# Patient Record
Sex: Female | Born: 1995 | Race: Black or African American | Hispanic: No | Marital: Single | Smoking: Never smoker
Health system: Southern US, Community
[De-identification: ages and names within clinical notes are randomized; demographics above are authoritative.]

## PROBLEM LIST (undated history)

## (undated) DIAGNOSIS — R569 Unspecified convulsions: Secondary | ICD-10-CM

## (undated) DIAGNOSIS — G43909 Migraine, unspecified, not intractable, without status migrainosus: Secondary | ICD-10-CM

## (undated) DIAGNOSIS — L0291 Cutaneous abscess, unspecified: Secondary | ICD-10-CM

---
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
## 2019-11-08 DIAGNOSIS — Z419 Encounter for procedure for purposes other than remedying health state, unspecified: Secondary | ICD-10-CM | POA: Diagnosis not present

## 2019-12-09 DIAGNOSIS — Z419 Encounter for procedure for purposes other than remedying health state, unspecified: Secondary | ICD-10-CM | POA: Diagnosis not present

## 2020-01-09 DIAGNOSIS — Z419 Encounter for procedure for purposes other than remedying health state, unspecified: Secondary | ICD-10-CM | POA: Diagnosis not present

## 2020-02-08 DIAGNOSIS — Z419 Encounter for procedure for purposes other than remedying health state, unspecified: Secondary | ICD-10-CM | POA: Diagnosis not present

## 2020-03-10 DIAGNOSIS — Z419 Encounter for procedure for purposes other than remedying health state, unspecified: Secondary | ICD-10-CM | POA: Diagnosis not present

## 2020-04-09 DIAGNOSIS — Z419 Encounter for procedure for purposes other than remedying health state, unspecified: Secondary | ICD-10-CM | POA: Diagnosis not present

## 2020-05-10 DIAGNOSIS — Z419 Encounter for procedure for purposes other than remedying health state, unspecified: Secondary | ICD-10-CM | POA: Diagnosis not present

## 2020-06-10 DIAGNOSIS — Z419 Encounter for procedure for purposes other than remedying health state, unspecified: Secondary | ICD-10-CM | POA: Diagnosis not present

## 2020-07-08 DIAGNOSIS — Z419 Encounter for procedure for purposes other than remedying health state, unspecified: Secondary | ICD-10-CM | POA: Diagnosis not present

## 2020-08-08 DIAGNOSIS — Z419 Encounter for procedure for purposes other than remedying health state, unspecified: Secondary | ICD-10-CM | POA: Diagnosis not present

## 2020-09-07 DIAGNOSIS — Z419 Encounter for procedure for purposes other than remedying health state, unspecified: Secondary | ICD-10-CM | POA: Diagnosis not present

## 2020-10-08 DIAGNOSIS — Z419 Encounter for procedure for purposes other than remedying health state, unspecified: Secondary | ICD-10-CM | POA: Diagnosis not present

## 2020-11-07 DIAGNOSIS — Z419 Encounter for procedure for purposes other than remedying health state, unspecified: Secondary | ICD-10-CM | POA: Diagnosis not present

## 2020-12-08 DIAGNOSIS — Z419 Encounter for procedure for purposes other than remedying health state, unspecified: Secondary | ICD-10-CM | POA: Diagnosis not present

## 2021-01-08 DIAGNOSIS — Z419 Encounter for procedure for purposes other than remedying health state, unspecified: Secondary | ICD-10-CM | POA: Diagnosis not present

## 2021-02-07 DIAGNOSIS — Z419 Encounter for procedure for purposes other than remedying health state, unspecified: Secondary | ICD-10-CM | POA: Diagnosis not present

## 2021-03-10 DIAGNOSIS — Z419 Encounter for procedure for purposes other than remedying health state, unspecified: Secondary | ICD-10-CM | POA: Diagnosis not present

## 2021-04-09 DIAGNOSIS — Z419 Encounter for procedure for purposes other than remedying health state, unspecified: Secondary | ICD-10-CM | POA: Diagnosis not present

## 2021-05-10 DIAGNOSIS — Z419 Encounter for procedure for purposes other than remedying health state, unspecified: Secondary | ICD-10-CM | POA: Diagnosis not present

## 2021-06-10 DIAGNOSIS — Z419 Encounter for procedure for purposes other than remedying health state, unspecified: Secondary | ICD-10-CM | POA: Diagnosis not present

## 2021-07-08 DIAGNOSIS — Z419 Encounter for procedure for purposes other than remedying health state, unspecified: Secondary | ICD-10-CM | POA: Diagnosis not present

## 2021-08-08 DIAGNOSIS — Z419 Encounter for procedure for purposes other than remedying health state, unspecified: Secondary | ICD-10-CM | POA: Diagnosis not present

## 2021-09-07 DIAGNOSIS — Z419 Encounter for procedure for purposes other than remedying health state, unspecified: Secondary | ICD-10-CM | POA: Diagnosis not present

## 2021-10-08 DIAGNOSIS — Z419 Encounter for procedure for purposes other than remedying health state, unspecified: Secondary | ICD-10-CM | POA: Diagnosis not present

## 2021-11-07 DIAGNOSIS — Z419 Encounter for procedure for purposes other than remedying health state, unspecified: Secondary | ICD-10-CM | POA: Diagnosis not present

## 2021-12-08 DIAGNOSIS — Z419 Encounter for procedure for purposes other than remedying health state, unspecified: Secondary | ICD-10-CM | POA: Diagnosis not present

## 2022-01-08 DIAGNOSIS — Z419 Encounter for procedure for purposes other than remedying health state, unspecified: Secondary | ICD-10-CM | POA: Diagnosis not present

## 2022-02-07 DIAGNOSIS — Z419 Encounter for procedure for purposes other than remedying health state, unspecified: Secondary | ICD-10-CM | POA: Diagnosis not present

## 2022-03-10 DIAGNOSIS — Z419 Encounter for procedure for purposes other than remedying health state, unspecified: Secondary | ICD-10-CM | POA: Diagnosis not present

## 2022-04-09 DIAGNOSIS — Z419 Encounter for procedure for purposes other than remedying health state, unspecified: Secondary | ICD-10-CM | POA: Diagnosis not present

## 2022-05-10 DIAGNOSIS — Z419 Encounter for procedure for purposes other than remedying health state, unspecified: Secondary | ICD-10-CM | POA: Diagnosis not present

## 2022-06-10 DIAGNOSIS — Z419 Encounter for procedure for purposes other than remedying health state, unspecified: Secondary | ICD-10-CM | POA: Diagnosis not present

## 2022-07-09 DIAGNOSIS — Z419 Encounter for procedure for purposes other than remedying health state, unspecified: Secondary | ICD-10-CM | POA: Diagnosis not present

## 2022-08-09 DIAGNOSIS — Z419 Encounter for procedure for purposes other than remedying health state, unspecified: Secondary | ICD-10-CM | POA: Diagnosis not present

## 2022-09-08 DIAGNOSIS — Z419 Encounter for procedure for purposes other than remedying health state, unspecified: Secondary | ICD-10-CM | POA: Diagnosis not present

## 2022-10-09 DIAGNOSIS — Z419 Encounter for procedure for purposes other than remedying health state, unspecified: Secondary | ICD-10-CM | POA: Diagnosis not present

## 2022-11-08 DIAGNOSIS — Z419 Encounter for procedure for purposes other than remedying health state, unspecified: Secondary | ICD-10-CM | POA: Diagnosis not present

## 2022-12-09 DIAGNOSIS — Z419 Encounter for procedure for purposes other than remedying health state, unspecified: Secondary | ICD-10-CM | POA: Diagnosis not present

## 2023-01-09 DIAGNOSIS — Z419 Encounter for procedure for purposes other than remedying health state, unspecified: Secondary | ICD-10-CM | POA: Diagnosis not present

## 2023-02-08 DIAGNOSIS — Z419 Encounter for procedure for purposes other than remedying health state, unspecified: Secondary | ICD-10-CM | POA: Diagnosis not present

## 2023-03-11 DIAGNOSIS — Z419 Encounter for procedure for purposes other than remedying health state, unspecified: Secondary | ICD-10-CM | POA: Diagnosis not present
